# Patient Record
Sex: Female | Born: 1965 | Race: White | Hispanic: No | Marital: Married | Smoking: Former smoker
Health system: Southern US, Community
[De-identification: ages and names within clinical notes are randomized; demographics above are authoritative.]

## PROBLEM LIST (undated history)

## (undated) DIAGNOSIS — R200 Anesthesia of skin: Secondary | ICD-10-CM

## (undated) DIAGNOSIS — Z8709 Personal history of other diseases of the respiratory system: Secondary | ICD-10-CM

## (undated) DIAGNOSIS — F32A Depression, unspecified: Secondary | ICD-10-CM

## (undated) DIAGNOSIS — D649 Anemia, unspecified: Secondary | ICD-10-CM

## (undated) DIAGNOSIS — F329 Major depressive disorder, single episode, unspecified: Secondary | ICD-10-CM

## (undated) DIAGNOSIS — F419 Anxiety disorder, unspecified: Secondary | ICD-10-CM

## (undated) DIAGNOSIS — K219 Gastro-esophageal reflux disease without esophagitis: Secondary | ICD-10-CM

## (undated) DIAGNOSIS — K659 Peritonitis, unspecified: Secondary | ICD-10-CM

## (undated) HISTORY — PX: HERNIA REPAIR: SHX51

## (undated) HISTORY — PX: ESOPHAGOGASTRODUODENOSCOPY: SHX1529

## (undated) HISTORY — PX: GASTRIC BYPASS: SHX52

## (undated) HISTORY — PX: CHOLECYSTECTOMY: SHX55

---

## 2005-02-22 ENCOUNTER — Observation Stay: Payer: Self-pay | Admitting: Obstetrics and Gynecology

## 2005-03-08 ENCOUNTER — Observation Stay: Payer: Self-pay | Admitting: Obstetrics and Gynecology

## 2005-03-19 ENCOUNTER — Inpatient Hospital Stay: Payer: Self-pay

## 2008-06-08 ENCOUNTER — Ambulatory Visit: Payer: Self-pay

## 2014-07-17 ENCOUNTER — Other Ambulatory Visit: Payer: Self-pay | Admitting: Internal Medicine

## 2014-07-17 DIAGNOSIS — N6459 Other signs and symptoms in breast: Secondary | ICD-10-CM

## 2014-08-04 ENCOUNTER — Ambulatory Visit: Payer: Self-pay

## 2014-08-04 ENCOUNTER — Ambulatory Visit
Admission: RE | Admit: 2014-08-04 | Discharge: 2014-08-04 | Disposition: A | Discharge status: Home / Self Care | Payer: No Typology Code available for payment source | Source: Ambulatory Visit | Attending: Internal Medicine | Admitting: Internal Medicine

## 2014-08-04 ENCOUNTER — Other Ambulatory Visit: Payer: Self-pay | Admitting: Internal Medicine

## 2014-08-04 DIAGNOSIS — N6459 Other signs and symptoms in breast: Secondary | ICD-10-CM

## 2014-08-04 DIAGNOSIS — Z1231 Encounter for screening mammogram for malignant neoplasm of breast: Secondary | ICD-10-CM | POA: Insufficient documentation

## 2015-06-08 ENCOUNTER — Other Ambulatory Visit: Payer: Self-pay | Admitting: Internal Medicine

## 2015-06-08 DIAGNOSIS — M5116 Intervertebral disc disorders with radiculopathy, lumbar region: Secondary | ICD-10-CM

## 2015-06-24 ENCOUNTER — Ambulatory Visit
Admission: RE | Admit: 2015-06-24 | Discharge: 2015-06-24 | Disposition: A | Discharge status: Home / Self Care | Payer: BLUE CROSS/BLUE SHIELD | Source: Ambulatory Visit | Attending: Internal Medicine | Admitting: Internal Medicine

## 2015-06-24 DIAGNOSIS — M4806 Spinal stenosis, lumbar region: Secondary | ICD-10-CM | POA: Diagnosis not present

## 2015-06-24 DIAGNOSIS — M5116 Intervertebral disc disorders with radiculopathy, lumbar region: Secondary | ICD-10-CM | POA: Diagnosis not present

## 2015-07-13 ENCOUNTER — Other Ambulatory Visit: Payer: Self-pay | Admitting: Neurosurgery

## 2015-08-09 ENCOUNTER — Other Ambulatory Visit: Payer: Self-pay | Admitting: Internal Medicine

## 2015-08-09 DIAGNOSIS — Z1231 Encounter for screening mammogram for malignant neoplasm of breast: Secondary | ICD-10-CM

## 2015-08-17 ENCOUNTER — Encounter (HOSPITAL_COMMUNITY): Payer: Self-pay

## 2015-08-17 ENCOUNTER — Encounter (HOSPITAL_COMMUNITY)
Admission: RE | Admit: 2015-08-17 | Discharge: 2015-08-17 | Disposition: A | Discharge status: Home / Self Care | Payer: BLUE CROSS/BLUE SHIELD | Source: Ambulatory Visit | Attending: Neurosurgery | Admitting: Neurosurgery

## 2015-08-17 DIAGNOSIS — Z79899 Other long term (current) drug therapy: Secondary | ICD-10-CM | POA: Insufficient documentation

## 2015-08-17 DIAGNOSIS — M7138 Other bursal cyst, other site: Secondary | ICD-10-CM | POA: Insufficient documentation

## 2015-08-17 DIAGNOSIS — R001 Bradycardia, unspecified: Secondary | ICD-10-CM | POA: Insufficient documentation

## 2015-08-17 DIAGNOSIS — R9431 Abnormal electrocardiogram [ECG] [EKG]: Secondary | ICD-10-CM | POA: Insufficient documentation

## 2015-08-17 DIAGNOSIS — Z01812 Encounter for preprocedural laboratory examination: Secondary | ICD-10-CM | POA: Diagnosis not present

## 2015-08-17 DIAGNOSIS — I10 Essential (primary) hypertension: Secondary | ICD-10-CM | POA: Insufficient documentation

## 2015-08-17 DIAGNOSIS — Z01818 Encounter for other preprocedural examination: Secondary | ICD-10-CM | POA: Diagnosis present

## 2015-08-17 DIAGNOSIS — Z87891 Personal history of nicotine dependence: Secondary | ICD-10-CM | POA: Diagnosis not present

## 2015-08-17 HISTORY — DX: Peritonitis, unspecified: K65.9

## 2015-08-17 HISTORY — DX: Gastro-esophageal reflux disease without esophagitis: K21.9

## 2015-08-17 HISTORY — DX: Anxiety disorder, unspecified: F41.9

## 2015-08-17 HISTORY — DX: Anesthesia of skin: R20.0

## 2015-08-17 HISTORY — DX: Personal history of other diseases of the respiratory system: Z87.09

## 2015-08-17 HISTORY — DX: Depression, unspecified: F32.A

## 2015-08-17 HISTORY — DX: Major depressive disorder, single episode, unspecified: F32.9

## 2015-08-17 HISTORY — DX: Anemia, unspecified: D64.9

## 2015-08-17 LAB — BASIC METABOLIC PANEL
Anion gap: 13 (ref 5–15)
BUN: 8 mg/dL (ref 6–20)
CALCIUM: 9.3 mg/dL (ref 8.9–10.3)
CO2: 25 mmol/L (ref 22–32)
CREATININE: 0.83 mg/dL (ref 0.44–1.00)
Chloride: 102 mmol/L (ref 101–111)
GFR calc non Af Amer: >60 mL/min (ref >60)
Glucose, Bld: 94 mg/dL (ref 65–99)
Potassium: 3.3 mmol/L — ABNORMAL LOW (ref 3.5–5.1)
SODIUM: 140 mmol/L (ref 135–145)

## 2015-08-17 LAB — CBC
HCT: 37.8 % (ref 36.0–46.0)
Hemoglobin: 12.6 g/dL (ref 12.0–15.0)
MCH: 26.6 pg (ref 26.0–34.0)
MCHC: 33.3 g/dL (ref 30.0–36.0)
MCV: 79.7 fL (ref 78.0–100.0)
PLATELETS: 259 10*3/uL (ref 150–400)
RBC: 4.74 MIL/uL (ref 3.87–5.11)
RDW: 13.1 % (ref 11.5–15.5)
WBC: 5.8 10*3/uL (ref 4.0–10.5)

## 2015-08-17 LAB — SURGICAL PCR SCREEN
MRSA, PCR: NEGATIVE
Staphylococcus aureus: POSITIVE — AB

## 2015-08-17 LAB — HCG, SERUM, QUALITATIVE: Preg, Serum: NEGATIVE

## 2015-08-17 NOTE — Progress Notes (Signed)
Patient called to inform nurse that she spoke with her PCP and was started on Norvasc 5 mg daily and will continue to monitor her blood pressure at home.

## 2015-08-17 NOTE — Progress Notes (Signed)
PCP- Bethann PunchesMark Miller Cardiologist - denies EKG 08/17/15 CXR - denies  ECHO/Stress Test/Cardiac cath denies  Patient denies chest pain and SOB at PAT appt, blood pressure elevated at arrival, rechecked b/p after patient had been at rest and b/p remained elevated Patient encouraged to contact PCP, patient verbalized understanding,  patient informed RN that she monitors her blood pressure at home and states that it is 130s/80s but been running higher lately and is concerned it is a problem  Chart forwarded to Stefanie LibelAllison Zenalak, PA

## 2015-08-17 NOTE — Progress Notes (Signed)
Patient notified of positive PCR for Staph. Pt states she has prescription for mupirocin and will treat as prescribed.

## 2015-08-17 NOTE — Pre-Procedure Instructions (Addendum)
    Kaitlin [name]  08/17/2015      [facility] [place] - [address] [place] [address] Phone: [phone] Fax: [phone]    Your procedure is scheduled on Wednesday, May 24th, 2017  [facility] Admitting at 8:45 A.M.  Call this number if you have problems the morning of surgery:  [phone]   Remember:  Do not eat food or drink liquids after midnight.  Take these medicines the morning of surgery with A SIP OF WATER Hydrocodone-acetaminophen (Vicodin) if needed, omeprazole (prilosec), tramadol (ultram) if needed  7 days prior to surgery stop taking Diclofenac (voltaren), Aspirin, NSAID's, Aleve, Naproxen, Ibuprofen, Advil, motrin, BC's, Goody's, fish oil, all herbal medications, all vitamins   Do not wear jewelry, make-up or nail polish.  Do not wear lotions, powders, or perfumes.  You may NOT wear deodorant.  Do not shave 48 hours prior to surgery.    Do not bring valuables to the hospital.   [facility] is not responsible for any belongings or valuables.  Contacts, dentures or bridgework may not be worn into surgery.  Leave your suitcase in the car.  After surgery it may be brought to your room.  For patients admitted to the hospital, discharge time will be determined by your treatment team.  Patients discharged the day of surgery will not be allowed to drive home.    Special instructions:  See attached  Please read over the following fact sheets that you were given. Pain Booklet, Coughing and Deep Breathing, MRSA Information and Surgical Site Infection Prevention

## 2015-08-18 NOTE — Progress Notes (Signed)
Anesthesia Chart Review:  Pt is a 50 year old female scheduled for L4-5, L5-S1 lumbar laminectomy/ decompression microdiscectomy on 08/25/2015 with Dr. Lovell SheehanJenkins.   PCP is Dr. Bethann PunchesMark Miller (care everywhere) who is aware of upcoming surgery.   PMH includes:  Anemia. Former smoker. BMI 32  BP at PAT was 182/104 and 162/102 on recheck. After PAT, pt contacted her PCP's office about bp and was started on amlodipine 5mg .   Medications include: amlodipine, prilosec  Preoperative labs reviewed.   EKG 08/17/15: Sinus bradycardia (55 bpm). Septal infarct, age undetermined  If bp acceptable DOS, I anticipate pt can proceed with surgery as scheduled.   Rica Mastngela Kabbe, FNP-BC [facility]/Anesthesiology Phone: [phone] 08/18/2015 4:10 PM

## 2015-08-25 ENCOUNTER — Ambulatory Visit (HOSPITAL_COMMUNITY)
Admission: RE | Admit: 2015-08-25 | Discharge: 2015-08-26 | Disposition: A | Discharge status: Home / Self Care | Payer: BLUE CROSS/BLUE SHIELD | Source: Ambulatory Visit | Attending: Neurosurgery | Admitting: Neurosurgery

## 2015-08-25 ENCOUNTER — Encounter (HOSPITAL_COMMUNITY)
Admission: RE | Disposition: A | Discharge status: Home / Self Care | Payer: Self-pay | Source: Ambulatory Visit | Attending: Neurosurgery

## 2015-08-25 ENCOUNTER — Ambulatory Visit (HOSPITAL_COMMUNITY): Payer: BLUE CROSS/BLUE SHIELD | Admitting: Certified Registered Nurse Anesthetist

## 2015-08-25 ENCOUNTER — Encounter (HOSPITAL_COMMUNITY): Payer: Self-pay | Admitting: *Deleted

## 2015-08-25 ENCOUNTER — Ambulatory Visit (HOSPITAL_COMMUNITY): Payer: BLUE CROSS/BLUE SHIELD

## 2015-08-25 ENCOUNTER — Ambulatory Visit (HOSPITAL_COMMUNITY): Payer: BLUE CROSS/BLUE SHIELD | Admitting: Emergency Medicine

## 2015-08-25 DIAGNOSIS — F419 Anxiety disorder, unspecified: Secondary | ICD-10-CM | POA: Insufficient documentation

## 2015-08-25 DIAGNOSIS — I10 Essential (primary) hypertension: Secondary | ICD-10-CM | POA: Diagnosis not present

## 2015-08-25 DIAGNOSIS — Z9884 Bariatric surgery status: Secondary | ICD-10-CM | POA: Insufficient documentation

## 2015-08-25 DIAGNOSIS — M5416 Radiculopathy, lumbar region: Secondary | ICD-10-CM | POA: Diagnosis not present

## 2015-08-25 DIAGNOSIS — M7138 Other bursal cyst, other site: Secondary | ICD-10-CM | POA: Diagnosis present

## 2015-08-25 DIAGNOSIS — E669 Obesity, unspecified: Secondary | ICD-10-CM | POA: Diagnosis not present

## 2015-08-25 DIAGNOSIS — Z87891 Personal history of nicotine dependence: Secondary | ICD-10-CM | POA: Diagnosis not present

## 2015-08-25 DIAGNOSIS — M549 Dorsalgia, unspecified: Secondary | ICD-10-CM

## 2015-08-25 HISTORY — PX: LUMBAR LAMINECTOMY/DECOMPRESSION MICRODISCECTOMY: SHX5026

## 2015-08-25 SURGERY — LUMBAR LAMINECTOMY/DECOMPRESSION MICRODISCECTOMY 2 LEVELS
Anesthesia: General | Site: Back | Laterality: Right

## 2015-08-25 MED ORDER — BACITRACIN ZINC 500 UNIT/GM EX OINT
TOPICAL_OINTMENT | CUTANEOUS | Status: DC | PRN
  Administered 2015-08-25: 1 via TOPICAL

## 2015-08-25 MED ORDER — MIDAZOLAM HCL 2 MG/2ML IJ SOLN
INTRAMUSCULAR | Status: AC
  Filled 2015-08-25: qty 2

## 2015-08-25 MED ORDER — NEOSTIGMINE METHYLSULFATE 5 MG/5ML IV SOSY
PREFILLED_SYRINGE | INTRAVENOUS | Status: DC | PRN
  Administered 2015-08-25: 3 mg via INTRAVENOUS

## 2015-08-25 MED ORDER — ROCURONIUM BROMIDE 10 MG/ML (PF) SYRINGE
PREFILLED_SYRINGE | INTRAVENOUS | Status: DC | PRN
  Administered 2015-08-25: 50 mg via INTRAVENOUS

## 2015-08-25 MED ORDER — BUPIVACAINE LIPOSOME 1.3 % IJ SUSP
INTRAMUSCULAR | Status: DC | PRN
  Administered 2015-08-25: 20 mL

## 2015-08-25 MED ORDER — ACETAMINOPHEN 325 MG PO TABS: 650.0000 mg | ORAL_TABLET | ORAL | Status: DC | PRN

## 2015-08-25 MED ORDER — 0.9 % SODIUM CHLORIDE (POUR BTL) OPTIME
TOPICAL | Status: DC | PRN
  Administered 2015-08-25: 1000 mL

## 2015-08-25 MED ORDER — AMLODIPINE BESYLATE 5 MG PO TABS
5.0000 mg | ORAL_TABLET | Freq: Every day | ORAL | Status: DC
  Filled 2015-08-25: qty 1

## 2015-08-25 MED ORDER — NEOSTIGMINE METHYLSULFATE 5 MG/5ML IV SOSY
PREFILLED_SYRINGE | INTRAVENOUS | Status: AC
  Filled 2015-08-25: qty 10

## 2015-08-25 MED ORDER — PHENYLEPHRINE HCL 10 MG/ML IJ SOLN
INTRAMUSCULAR | Status: AC
  Filled 2015-08-25: qty 1

## 2015-08-25 MED ORDER — EPHEDRINE SULFATE-NACL 50-0.9 MG/10ML-% IV SOSY
PREFILLED_SYRINGE | INTRAVENOUS | Status: DC | PRN
  Administered 2015-08-25: 5 mg via INTRAVENOUS

## 2015-08-25 MED ORDER — SODIUM CHLORIDE 0.9 % IR SOLN
Status: DC | PRN
  Administered 2015-08-25: 500 mL

## 2015-08-25 MED ORDER — ACETAMINOPHEN 650 MG RE SUPP: 650.0000 mg | RECTAL | Status: DC | PRN

## 2015-08-25 MED ORDER — DOCUSATE SODIUM 100 MG PO CAPS
100.0000 mg | ORAL_CAPSULE | Freq: Two times a day (BID) | ORAL | Status: DC
  Administered 2015-08-25 (×2): 100 mg via ORAL
  Filled 2015-08-25: qty 1

## 2015-08-25 MED ORDER — ROCURONIUM BROMIDE 50 MG/5ML IV SOLN
INTRAVENOUS | Status: AC
  Filled 2015-08-25: qty 5

## 2015-08-25 MED ORDER — TRAMADOL HCL 50 MG PO TABS
50.0000 mg | ORAL_TABLET | Freq: Four times a day (QID) | ORAL | Status: DC | PRN
Start: 2015-08-25 — End: 2015-08-26

## 2015-08-25 MED ORDER — GLYCOPYRROLATE 0.2 MG/ML IV SOSY
PREFILLED_SYRINGE | INTRAVENOUS | Status: DC | PRN
  Administered 2015-08-25: .4 mg via INTRAVENOUS

## 2015-08-25 MED ORDER — LIDOCAINE 2% (20 MG/ML) 5 ML SYRINGE
INTRAMUSCULAR | Status: DC | PRN
  Administered 2015-08-25: 100 mg via INTRAVENOUS

## 2015-08-25 MED ORDER — BUPIVACAINE LIPOSOME 1.3 % IJ SUSP
20.0000 mL | INTRAMUSCULAR | Status: DC
  Filled 2015-08-25: qty 20

## 2015-08-25 MED ORDER — DICLOFENAC SODIUM 75 MG PO TBEC
75.0000 mg | DELAYED_RELEASE_TABLET | Freq: Two times a day (BID) | ORAL | Status: DC
  Administered 2015-08-25: 75 mg via ORAL
  Filled 2015-08-25 (×3): qty 1

## 2015-08-25 MED ORDER — HYDROMORPHONE HCL 1 MG/ML IJ SOLN
INTRAMUSCULAR | Status: AC
  Administered 2015-08-25: 0.5 mg via INTRAVENOUS
  Filled 2015-08-25: qty 1

## 2015-08-25 MED ORDER — EPHEDRINE 5 MG/ML INJ
INTRAVENOUS | Status: AC
  Filled 2015-08-25: qty 10

## 2015-08-25 MED ORDER — HEMOSTATIC AGENTS (NO CHARGE) OPTIME
TOPICAL | Status: DC | PRN
  Administered 2015-08-25: 1 via TOPICAL

## 2015-08-25 MED ORDER — ONDANSETRON HCL 4 MG/2ML IJ SOLN
INTRAMUSCULAR | Status: DC | PRN
  Administered 2015-08-25: 4 mg via INTRAVENOUS

## 2015-08-25 MED ORDER — ALUM & MAG HYDROXIDE-SIMETH 200-200-20 MG/5ML PO SUSP: 30.0000 mL | Freq: Four times a day (QID) | ORAL | Status: DC | PRN

## 2015-08-25 MED ORDER — PROPOFOL 10 MG/ML IV BOLUS
INTRAVENOUS | Status: DC | PRN
  Administered 2015-08-25: 150 mg via INTRAVENOUS

## 2015-08-25 MED ORDER — LIDOCAINE 2% (20 MG/ML) 5 ML SYRINGE
INTRAMUSCULAR | Status: AC
  Filled 2015-08-25: qty 15

## 2015-08-25 MED ORDER — THROMBIN 5000 UNITS EX SOLR
CUTANEOUS | Status: DC | PRN
  Administered 2015-08-25 (×2): 5000 [IU] via TOPICAL

## 2015-08-25 MED ORDER — FENTANYL CITRATE (PF) 100 MCG/2ML IJ SOLN
INTRAMUSCULAR | Status: AC
  Administered 2015-08-25: 50 ug via INTRAVENOUS
  Filled 2015-08-25: qty 2

## 2015-08-25 MED ORDER — ONDANSETRON HCL 4 MG/2ML IJ SOLN: 4.0000 mg | INTRAMUSCULAR | Status: DC | PRN

## 2015-08-25 MED ORDER — HYDROCODONE-ACETAMINOPHEN 5-325 MG PO TABS: 1.0000 | ORAL_TABLET | ORAL | Status: DC | PRN

## 2015-08-25 MED ORDER — PHENYLEPHRINE 40 MCG/ML (10ML) SYRINGE FOR IV PUSH (FOR BLOOD PRESSURE SUPPORT)
PREFILLED_SYRINGE | INTRAVENOUS | Status: AC
  Filled 2015-08-25: qty 20

## 2015-08-25 MED ORDER — PHENOL 1.4 % MT LIQD: 1.0000 | OROMUCOSAL | Status: DC | PRN

## 2015-08-25 MED ORDER — SUCCINYLCHOLINE CHLORIDE 200 MG/10ML IV SOSY
PREFILLED_SYRINGE | INTRAVENOUS | Status: AC
  Filled 2015-08-25: qty 10

## 2015-08-25 MED ORDER — DEXAMETHASONE SODIUM PHOSPHATE 10 MG/ML IJ SOLN
INTRAMUSCULAR | Status: DC | PRN
  Administered 2015-08-25: 5 mg via INTRAVENOUS

## 2015-08-25 MED ORDER — GLYCOPYRROLATE 0.2 MG/ML IV SOSY
PREFILLED_SYRINGE | INTRAVENOUS | Status: AC
  Filled 2015-08-25: qty 6

## 2015-08-25 MED ORDER — MORPHINE SULFATE (PF) 2 MG/ML IV SOLN: 1.0000 mg | INTRAVENOUS | Status: DC | PRN

## 2015-08-25 MED ORDER — DIAZEPAM 5 MG PO TABS
5.0000 mg | ORAL_TABLET | Freq: Four times a day (QID) | ORAL | Status: DC | PRN
  Administered 2015-08-25: 5 mg via ORAL

## 2015-08-25 MED ORDER — PHENYLEPHRINE 40 MCG/ML (10ML) SYRINGE FOR IV PUSH (FOR BLOOD PRESSURE SUPPORT)
PREFILLED_SYRINGE | INTRAVENOUS | Status: DC | PRN
  Administered 2015-08-25 (×3): 40 ug via INTRAVENOUS

## 2015-08-25 MED ORDER — MENTHOL 3 MG MT LOZG: 1.0000 | LOZENGE | OROMUCOSAL | Status: DC | PRN

## 2015-08-25 MED ORDER — CEFAZOLIN SODIUM-DEXTROSE 2-4 GM/100ML-% IV SOLN
2.0000 g | INTRAVENOUS | Status: AC
  Administered 2015-08-25: 2 g via INTRAVENOUS
  Filled 2015-08-25: qty 100

## 2015-08-25 MED ORDER — PANTOPRAZOLE SODIUM 40 MG PO TBEC: 40.0000 mg | DELAYED_RELEASE_TABLET | Freq: Every day | ORAL | Status: DC

## 2015-08-25 MED ORDER — FENTANYL CITRATE (PF) 100 MCG/2ML IJ SOLN
INTRAMUSCULAR | Status: DC | PRN
  Administered 2015-08-25: 25 ug via INTRAVENOUS
  Administered 2015-08-25: 50 ug via INTRAVENOUS
  Administered 2015-08-25: 25 ug via INTRAVENOUS
  Administered 2015-08-25: 50 ug via INTRAVENOUS
  Administered 2015-08-25: 100 ug via INTRAVENOUS

## 2015-08-25 MED ORDER — KETAMINE HCL 100 MG/ML IJ SOLN
INTRAMUSCULAR | Status: AC
  Administered 2015-08-25: 30 mg via INTRAVENOUS
  Filled 2015-08-25: qty 1

## 2015-08-25 MED ORDER — DEXAMETHASONE SODIUM PHOSPHATE 10 MG/ML IJ SOLN
INTRAMUSCULAR | Status: AC
  Filled 2015-08-25: qty 2

## 2015-08-25 MED ORDER — FENTANYL CITRATE (PF) 100 MCG/2ML IJ SOLN
25.0000 ug | INTRAMUSCULAR | Status: DC | PRN
  Administered 2015-08-25 (×3): 50 ug via INTRAVENOUS

## 2015-08-25 MED ORDER — OXYCODONE-ACETAMINOPHEN 5-325 MG PO TABS
ORAL_TABLET | ORAL | Status: AC
  Filled 2015-08-25: qty 2

## 2015-08-25 MED ORDER — DIAZEPAM 5 MG PO TABS
ORAL_TABLET | ORAL | Status: AC
  Filled 2015-08-25: qty 1

## 2015-08-25 MED ORDER — CEFAZOLIN SODIUM-DEXTROSE 2-4 GM/100ML-% IV SOLN
2.0000 g | Freq: Three times a day (TID) | INTRAVENOUS | Status: AC
  Administered 2015-08-25 – 2015-08-26 (×2): 2 g via INTRAVENOUS
  Filled 2015-08-25 (×2): qty 100

## 2015-08-25 MED ORDER — MIDAZOLAM HCL 5 MG/5ML IJ SOLN
INTRAMUSCULAR | Status: DC | PRN
  Administered 2015-08-25: 2 mg via INTRAVENOUS

## 2015-08-25 MED ORDER — BUPIVACAINE-EPINEPHRINE (PF) 0.5% -1:200000 IJ SOLN
INTRAMUSCULAR | Status: DC | PRN
  Administered 2015-08-25: 10 mL via PERINEURAL

## 2015-08-25 MED ORDER — OXYCODONE-ACETAMINOPHEN 5-325 MG PO TABS
1.0000 | ORAL_TABLET | ORAL | Status: DC | PRN
  Administered 2015-08-25 – 2015-08-26 (×3): 2 via ORAL
  Filled 2015-08-25 (×2): qty 2

## 2015-08-25 MED ORDER — FENTANYL CITRATE (PF) 250 MCG/5ML IJ SOLN
INTRAMUSCULAR | Status: AC
  Filled 2015-08-25: qty 5

## 2015-08-25 MED ORDER — ONDANSETRON HCL 4 MG/2ML IJ SOLN
INTRAMUSCULAR | Status: AC
  Filled 2015-08-25: qty 6

## 2015-08-25 MED ORDER — BISACODYL 10 MG RE SUPP: 10.0000 mg | Freq: Every day | RECTAL | Status: DC | PRN

## 2015-08-25 MED ORDER — LACTATED RINGERS IV SOLN
INTRAVENOUS | Status: DC
  Administered 2015-08-25 (×2): via INTRAVENOUS

## 2015-08-25 MED ORDER — HYDROMORPHONE HCL 1 MG/ML IJ SOLN
0.5000 mg | INTRAMUSCULAR | Status: AC | PRN
  Administered 2015-08-25 (×2): 0.5 mg via INTRAVENOUS

## 2015-08-25 MED ORDER — LACTATED RINGERS IV SOLN: INTRAVENOUS | Status: DC

## 2015-08-25 SURGICAL SUPPLY — 47 items
BAG DECANTER FOR FLEXI CONT (MISCELLANEOUS) ×3 IMPLANT
BENZOIN TINCTURE PRP APPL 2/3 (GAUZE/BANDAGES/DRESSINGS) ×3 IMPLANT
BLADE CLIPPER SURG (BLADE) IMPLANT
BRUSH SCRUB EZ PLAIN DRY (MISCELLANEOUS) ×3 IMPLANT
BUR MATCHSTICK NEURO 3.0 LAGG (BURR) ×3 IMPLANT
BUR PRECISION FLUTE 6.0 (BURR) ×3 IMPLANT
CANISTER SUCT 3000ML PPV (MISCELLANEOUS) ×3 IMPLANT
CLOSURE WOUND 1/2 X4 (GAUZE/BANDAGES/DRESSINGS) ×1
DRAPE LAPAROTOMY 100X72X124 (DRAPES) ×3 IMPLANT
DRAPE MICROSCOPE LEICA (MISCELLANEOUS) ×3 IMPLANT
DRAPE POUCH INSTRU U-SHP 10X18 (DRAPES) ×3 IMPLANT
DRAPE SURG 17X23 STRL (DRAPES) ×12 IMPLANT
ELECT BLADE 4.0 EZ CLEAN MEGAD (MISCELLANEOUS) ×3
ELECT REM PT RETURN 9FT ADLT (ELECTROSURGICAL) ×3
ELECTRODE BLDE 4.0 EZ CLN MEGD (MISCELLANEOUS) ×1 IMPLANT
ELECTRODE REM PT RTRN 9FT ADLT (ELECTROSURGICAL) ×1 IMPLANT
GAUZE SPONGE 4X4 12PLY STRL (GAUZE/BANDAGES/DRESSINGS) ×3 IMPLANT
GAUZE SPONGE 4X4 16PLY XRAY LF (GAUZE/BANDAGES/DRESSINGS) IMPLANT
GLOVE BIO SURGEON STRL SZ8 (GLOVE) ×3 IMPLANT
GLOVE BIO SURGEON STRL SZ8.5 (GLOVE) ×3 IMPLANT
GLOVE EXAM NITRILE LRG STRL (GLOVE) IMPLANT
GLOVE EXAM NITRILE MD LF STRL (GLOVE) IMPLANT
GLOVE EXAM NITRILE XL STR (GLOVE) IMPLANT
GLOVE EXAM NITRILE XS STR PU (GLOVE) IMPLANT
GLOVE SS N UNI LF 7.0 STRL (GLOVE) ×6 IMPLANT
GOWN STRL REUS W/ TWL LRG LVL3 (GOWN DISPOSABLE) IMPLANT
GOWN STRL REUS W/ TWL XL LVL3 (GOWN DISPOSABLE) ×1 IMPLANT
GOWN STRL REUS W/TWL 2XL LVL3 (GOWN DISPOSABLE) IMPLANT
GOWN STRL REUS W/TWL LRG LVL3 (GOWN DISPOSABLE)
GOWN STRL REUS W/TWL XL LVL3 (GOWN DISPOSABLE) ×2
KIT BASIN OR (CUSTOM PROCEDURE TRAY) ×3 IMPLANT
KIT ROOM TURNOVER OR (KITS) ×3 IMPLANT
NEEDLE HYPO 21X1.5 SAFETY (NEEDLE) IMPLANT
NEEDLE HYPO 22GX1.5 SAFETY (NEEDLE) ×3 IMPLANT
NS IRRIG 1000ML POUR BTL (IV SOLUTION) ×3 IMPLANT
PACK LAMINECTOMY NEURO (CUSTOM PROCEDURE TRAY) ×3 IMPLANT
PAD ARMBOARD 7.5X6 YLW CONV (MISCELLANEOUS) ×9 IMPLANT
PATTIES SURGICAL .5 X1 (DISPOSABLE) IMPLANT
RUBBERBAND STERILE (MISCELLANEOUS) ×6 IMPLANT
SPONGE SURGIFOAM ABS GEL SZ50 (HEMOSTASIS) ×3 IMPLANT
STRIP CLOSURE SKIN 1/2X4 (GAUZE/BANDAGES/DRESSINGS) ×2 IMPLANT
SUT VIC AB 1 CT1 18XBRD ANBCTR (SUTURE) ×2 IMPLANT
SUT VIC AB 1 CT1 8-18 (SUTURE) ×4
SUT VIC AB 2-0 CP2 18 (SUTURE) ×6 IMPLANT
TOWEL OR 17X24 6PK STRL BLUE (TOWEL DISPOSABLE) ×3 IMPLANT
TOWEL OR 17X26 10 PK STRL BLUE (TOWEL DISPOSABLE) ×3 IMPLANT
WATER STERILE IRR 1000ML POUR (IV SOLUTION) ×3 IMPLANT

## 2015-08-25 NOTE — Transfer of Care (Signed)
Immediate Anesthesia Transfer of Care Note  Patient: Rochel BromeMichelle B Litzenberger  Procedure(s) Performed: Procedure(s) with comments: LUMBAR LAMINECTOMY/DECOMPRESSION MICRODISCECTOMY 2 LEVELS (Right) - Right L45 L5S1 laminectomy laminotomy with resection of synovial cyst  Patient Location: PACU  Anesthesia Type:General  Level of Consciousness: awake, alert  and oriented  Airway & Oxygen Therapy: Patient Spontanous Breathing and Patient connected to nasal cannula oxygen  Post-op Assessment: Report given to RN and Post -op Vital signs reviewed and stable  Post vital signs: Reviewed and stable  Last Vitals:  Filed Vitals:   08/25/15 0908  BP: 162/91  Pulse: 102  Temp: 36.8 C  Resp: 18    Last Pain: There were no vitals filed for this visit.       Complications: No apparent anesthesia complications

## 2015-08-25 NOTE — Anesthesia Procedure Notes (Signed)
Procedure Name: Intubation Date/Time: 08/25/2015 12:02 PM Performed by: Daiva EvesAVENEL, Ichelle Harral W Pre-anesthesia Checklist: Patient identified, Timeout performed, Emergency Drugs available, Suction available and Patient being monitored Patient Re-evaluated:Patient Re-evaluated prior to inductionOxygen Delivery Method: Circle system utilized Preoxygenation: Pre-oxygenation with 100% oxygen Intubation Type: IV induction Ventilation: Mask ventilation without difficulty Laryngoscope Size: Mac and 3 Grade View: Grade I Tube type: Oral Tube size: 7.0 mm Number of attempts: 1 Airway Equipment and Method: Stylet Placement Confirmation: breath sounds checked- equal and bilateral,  positive ETCO2,  CO2 detector and ETT inserted through vocal cords under direct vision Secured at: 22 cm Tube secured with: Tape Dental Injury: Teeth and Oropharynx as per pre-operative assessment

## 2015-08-25 NOTE — Anesthesia Preprocedure Evaluation (Signed)
Anesthesia Evaluation  Patient identified by MRN, date of birth, ID band Patient awake    Reviewed: Allergy & Precautions, NPO status , Patient's Chart, lab work & pertinent test results  Airway Mallampati: I  TM Distance: >3 FB Neck ROM: Full    Dental  (+) Teeth Intact, Dental Advisory Given Veneers, caps in back, broken temporary tooth:   Pulmonary former smoker,    Pulmonary exam normal breath sounds clear to auscultation       Cardiovascular hypertension, Pt. on medications Normal cardiovascular exam Rhythm:Regular Rate:Normal     Neuro/Psych PSYCHIATRIC DISORDERS Anxiety Depression RLE numbness, tingling L5 cyst    GI/Hepatic Neg liver ROS, GERD  Medicated and Controlled,S/p gastric bypass   Endo/Other  Obesity   Renal/GU negative Renal ROS     Musculoskeletal negative musculoskeletal ROS (+)   Abdominal   Peds  Hematology negative hematology ROS (+)   Anesthesia Other Findings Day of surgery medications reviewed with the patient.  Reproductive/Obstetrics negative OB ROS                             Anesthesia Physical Anesthesia Plan  ASA: II  Anesthesia Plan: General   Post-op Pain Management:    Induction: Intravenous  Airway Management Planned: Oral ETT  Additional Equipment:   Intra-op Plan:   Post-operative Plan: Extubation in OR  Informed Consent: I have reviewed the patients History and Physical, chart, labs and discussed the procedure including the risks, benefits and alternatives for the proposed anesthesia with the patient or authorized representative who has indicated his/her understanding and acceptance.   Dental advisory given  Plan Discussed with: CRNA  Anesthesia Plan Comments: (Risks/benefits of general anesthesia discussed with patient including risk of damage to teeth, lips, gum, and tongue, nausea/vomiting, allergic reactions to medications, and  the possibility of heart attack, stroke and death.  All patient questions answered.  Patient wishes to proceed.)        Anesthesia Quick Evaluation

## 2015-08-25 NOTE — H&P (Signed)
Subjective: The patient is a 50 year old white female who has complained of right leg pain. She has failed medical management and was worked up with a lumbar MRI. This demonstrated a L4-5 and L5-S1 synovial cyst. I discussed the situation with the patient. She has decided to proceed with surgery   Past Medical History  Diagnosis Date  . GERD (gastroesophageal reflux disease)   . History of bronchitis   . Anxiety   . Depression     History of  . Peritonitis (HCC)     gastric bypass surgery  . Anemia   . Numbness     back of right calf    Past Surgical History  Procedure Laterality Date  . Cholecystectomy    . Gastric bypass    . Hernia repair    . Esophagogastroduodenoscopy      Not on File  Social History  Substance Use Topics  . Smoking status: Former Smoker    Quit date: 05/17/1998  . Smokeless tobacco: Never Used  . Alcohol Use: Yes     Comment: socially    History reviewed. No pertinent family history. Prior to Admission medications   Medication Sig Start Date End Date Taking? Authorizing Provider  amLODipine (NORVASC) 5 MG tablet Take 5 mg by mouth daily.   Yes Historical Provider, MD  diclofenac (VOLTAREN) 75 MG EC tablet Take 75 mg by mouth 2 (two) times daily. 07/17/15  Yes Historical Provider, MD  HYDROcodone-acetaminophen (NORCO/VICODIN) 5-325 MG tablet Take 1-2 tablets by mouth at bedtime. 07/13/15  Yes Historical Provider, MD  omeprazole (PRILOSEC) 20 MG capsule Take 20 mg by mouth daily as needed. allergies   Yes Historical Provider, MD  traMADol (ULTRAM) 50 MG tablet Take 50 mg by mouth 3 (three) times daily as needed. pain 06/16/15  Yes Historical Provider, MD     Review of Systems  Positive ROS: As above  All other systems have been reviewed and were otherwise negative with the exception of those mentioned in the HPI and as above.  Objective: Vital signs in last 24 hours: Temp:  [98.2 F (36.8 C)] 98.2 F (36.8 C) (05/24 0908) Pulse Rate:  [102] 102  (05/24 0908) Resp:  [18] 18 (05/24 0908) BP: (162)/(91) 162/91 mmHg (05/24 0908) SpO2:  [98 %] 98 % (05/24 0908) Weight:  [82.555 kg (182 lb)] 82.555 kg (182 lb) (05/24 0908)  General Appearance: Alert, cooperative, no distress, Head: Normocephalic, without obvious abnormality, atraumatic Eyes: PERRL, conjunctiva/corneas clear, EOM's intact,    Ears: Normal  Throat: Normal  Neck: Supple, symmetrical, trachea midline, no adenopathy; thyroid: No enlargement/tenderness/nodules; no carotid bruit or JVD Back: Symmetric, no curvature, ROM normal, no CVA tenderness Lungs: Clear to auscultation bilaterally, respirations unlabored Heart: Regular rate and rhythm, no murmur, rub or gallop Abdomen: Soft, non-tender,, no masses, no organomegaly Extremities: Extremities normal, atraumatic, no cyanosis or edema Pulses: 2+ and symmetric all extremities Skin: Skin color, texture, turgor normal, no rashes or lesions  NEUROLOGIC:   Mental status: alert and oriented, no aphasia, good attention span, Fund of knowledge/ memory ok Motor Exam - grossly normal Sensory Exam - grossly normal Reflexes:  Coordination - grossly normal Gait - grossly normal Balance - grossly normal Cranial Nerves: I: smell Not tested  II: visual acuity  OS: Normal  OD: Normal   II: visual fields Full to confrontation  II: pupils Equal, round, reactive to light  III,VII: ptosis None  III,IV,VI: extraocular muscles  Full ROM  V: mastication Normal  V: facial  light touch sensation  Normal  V,VII: corneal reflex  Present  VII: facial muscle function - upper  Normal  VII: facial muscle function - lower Normal  VIII: hearing Not tested  IX: soft palate elevation  Normal  IX,X: gag reflex Present  XI: trapezius strength  5/5  XI: sternocleidomastoid strength 5/5  XI: neck flexion strength  5/5  XII: tongue strength  Normal    Data Review Lab Results  Component Value Date   WBC 5.8 08/17/2015   HGB 12.6 08/17/2015    HCT 37.8 08/17/2015   MCV 79.7 08/17/2015   PLT 259 08/17/2015   Lab Results  Component Value Date   NA 140 08/17/2015   K 3.3* 08/17/2015   CL 102 08/17/2015   CO2 25 08/17/2015   BUN 8 08/17/2015   CREATININE 0.83 08/17/2015   GLUCOSE 94 08/17/2015   No results found for: INR, PROTIME  Assessment/Plan: 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 I have reviewed her MRI scan with her and pointed out the abnormalities. We have discussed the various treatment options including surgery. We have discussed the risks, benefits, alternatives, and likelihood of achieving her goals with surgery. I have answered all the patient's questions. She has decided to proceed with surgery.   Slayton Lubitz D 08/25/2015 10:56 AM

## 2015-08-25 NOTE — Progress Notes (Signed)
Subjective:  The patient is alert and pleasant. She is in no apparent distress. She looks well.  Objective: Vital signs in last 24 hours: Temp:  [98.2 F (36.8 C)] 98.2 F (36.8 C) (05/24 0908) Pulse Rate:  [102] 102 (05/24 0908) Resp:  [18] 18 (05/24 0908) BP: (162)/(91) 162/91 mmHg (05/24 0908) SpO2:  [98 %] 98 % (05/24 0908) Weight:  [82.555 kg (182 lb)] 82.555 kg (182 lb) (05/24 0908)  Intake/Output from previous day:   Intake/Output this shift: Total I/O In: 1300 [I.V.:1300] Out: 200 [Blood:200]  Physical exam the patient is alert and pleasant. She is moving her lower extremities well.  Lab Results: No results for input(s): WBC, HGB, HCT, PLT in the last 72 hours. BMET No results for input(s): NA, K, CL, CO2, GLUCOSE, BUN, CREATININE, CALCIUM in the last 72 hours.  Studies/Results: Dg Lumbar Spine 1 View  08/25/2015  CLINICAL DATA:  Intraoperative localization 4 L4-5 and L5-S1 laminectomy EXAM: LUMBAR SPINE - 1 VIEW COMPARISON:  06/24/2015 FINDINGS: Localization probe directed toward the L5-S1 disc space level. IMPRESSION: Intraoperative localization Electronically Signed   By: Esperanza Heiraymond  Rubner M.D.   On: 08/25/2015 13:28    Assessment/Plan: The patient is doing well. I spoke with her husband.      Jahyra Sukup D 08/25/2015, 2:24 PM

## 2015-08-25 NOTE — Op Note (Signed)
Brief history: The patient is a 50 year old white female who has complained of back and right leg pain consistent with a lumbar radiculopathy. She has failed medical management and was worked up with a lumbar MRI. This demonstrated large synovial cyst at L4-5 and L5-S1 on the right. I discussed the various treatment options with the patient including surgery. She has weighed the risks, benefits, and alternatives to surgery and decided proceed with a right L4-5 and L5-S1 laminotomy/laminectomy/foraminotomy for resection of synovial cyst.  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 Delma OfficerJeff Annamarie Yamaguchi  Asst.: Dr. Romeo AppleBen Ditty  Anesthesia: Gen. endotracheal  Estimated blood loss: 100 mL  Drains: None  Complications: None  Description of procedure: The patient was brought to the operating room by the anesthesia team. General endotracheal anesthesia was induced. The patient was turned to the prone position on the Wilson frame. The patient's lumbosacral region was then prepared with Betadine scrub and Betadine solution. Sterile drapes were applied.  I then injected the area to be incised with Marcaine with epinephrine solution. I then used a scalpel to make a linear midline incision over the L4-5 and L5-S1 intervertebral disc space. I then used electrocautery to perform a right sided subperiosteal dissection exposing the spinous process and lamina of L4, L5 and the upper sacrum. We obtained intraoperative radiograph to confirm our location. I then inserted the [facility] retractor for exposure.  We then brought the operative microscope into the field. Under its magnification and illumination we completed the  microdissection. I used a high-speed drill to perform a laminotomy at L4-5 and L5-S1 on the right. I then used a Kerrison punches to complete the right L4 hemilaminectomy and to widen the laminotomy at L5-S1 and removed the ligamentum flavum at L4-5 and L5-S1 on the right. We encountered large synovial cyst at both levels. We then used microdissection to free up the thecal sac and the right L4, L5 and S1 nerve root from the synovial cyst. We removed the synovial cyst using the pituitary forceps and the Kerrison punches. We sent a specimen to the pathologist. I then used a Kerrison punch to perform a foraminotomy at about the right L4, L5 and S1 nerve root. We inspected the intervertebral disc at L4-5 and L5-S1. There were no herniations.   I then palpated along the ventral surface of the thecal sac and along exit route of the right L4, L5 and S1 nerve root and noted that the neural structures were well decompressed. This completed the decompression.  We then obtained hemostasis using bipolar electrocautery. We irrigated the wound out with bacitracin solution. We then removed the retractor. We then reapproximated the patient's thoracolumbar fascia with interrupted #1 Vicryl suture. We then reapproximated the patient's subcutaneous tissue with interrupted 2-0 Vicryl suture. We then reapproximated patient's skin with Steri-Strips and benzoin. The was then coated with bacitracin ointment. The drapes were removed. The patient was subsequently returned to the supine position where they were extubated by the anesthesia team. The patient was then transported to the postanesthesia care unit in stable condition. All sponge instrument and needle counts were reportedly correct at the end of this case.

## 2015-08-26 ENCOUNTER — Encounter (HOSPITAL_COMMUNITY): Payer: Self-pay | Admitting: Neurosurgery

## 2015-08-26 DIAGNOSIS — M5416 Radiculopathy, lumbar region: Secondary | ICD-10-CM | POA: Diagnosis not present

## 2015-08-26 MED ORDER — OXYCODONE-ACETAMINOPHEN 5-325 MG PO TABS: 1.0000 | ORAL_TABLET | ORAL | Status: AC | PRN

## 2015-08-26 MED ORDER — DOCUSATE SODIUM 100 MG PO CAPS
100.0000 mg | ORAL_CAPSULE | Freq: Two times a day (BID) | ORAL | Status: AC
Start: 2015-08-26 — End: ?

## 2015-08-26 MED ORDER — CYCLOBENZAPRINE HCL 10 MG PO TABS: 10.0000 mg | ORAL_TABLET | Freq: Three times a day (TID) | ORAL | Status: AC | PRN

## 2015-08-26 NOTE — Discharge Summary (Signed)
Physician Discharge Summary  Patient ID: Kaitlin Kirby MRN: [id number] DOB/AGE: 1965/08/26 50 y.o.  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 The surgery went well.  The patient's postoperative course was unremarkable. On postoperative day #1 she requested discharge home. The patient, and her husband, were given written and oral discharge instructions. All other questions were answered.  Consults: Physical therapy Significant Diagnostic Studies: None Treatments: Right L4-5 and L5-S1 laminectomy/laminotomy/foraminotomy for resection of synovial cyst using micro-dissection to decompress the right L4, L5 and S1 nerve roots. Discharge Exam: Blood pressure 134/78, pulse 92, temperature 99 F (37.2 C), temperature source Oral, resp. rate 18, weight 82.555 kg (182 lb), last menstrual period 08/16/2015, SpO2 100 %. The patient is alert and pleasant. She looks well. Her strength is grossly normal. Her dressing is starting to fall off.  Disposition: Home  Discharge Instructions    Call MD for:  difficulty breathing, headache or visual disturbances    Complete by:  As directed      Call MD for:  extreme fatigue    Complete by:  As directed      Call MD for:  hives    Complete by:  As directed      Call MD for:  persistant dizziness or light-headedness    Complete by:  As directed      Call MD for:  persistant nausea and vomiting    Complete by:  As directed      Call MD for:  redness, tenderness, or signs of infection (pain, swelling, redness, odor or green/yellow discharge around incision site)    Complete by:  As directed      Call MD for:   severe uncontrolled pain    Complete by:  As directed      Call MD for:  temperature >100.4    Complete by:  As directed      Diet - low sodium heart healthy    Complete by:  As directed      Discharge instructions    Complete by:  As directed   Call [phone] for a followup appointment. Take a stool softener while you are using pain medications.     Driving Restrictions    Complete by:  As directed   Do not drive for 2 weeks.     Increase activity slowly    Complete by:  As directed      Lifting restrictions    Complete by:  As directed   Do not lift more than 5 pounds. No excessive bending or twisting.     May shower / Bathe    Complete by:  As directed   He may shower after the pain she is removed 3 days after surgery. Leave the incision alone.     Remove dressing in 48 hours    Complete by:  As directed   Your stitches are under the scan and will dissolve by themselves. The Steri-Strips will fall off after you take a few showers. Do not rub back or pick at the wound, Leave the wound alone.            Medication List    STOP taking these medications  HYDROcodone-acetaminophen 5-325 MG tablet  Commonly known as:  NORCO/VICODIN      TAKE these medications        amLODipine 5 MG tablet  Commonly known as:  NORVASC  Take 5 mg by mouth daily.     cyclobenzaprine 10 MG tablet  Commonly known as:  FLEXERIL  Take 1 tablet (10 mg total) by mouth 3 (three) times daily as needed.     diclofenac 75 MG EC tablet  Commonly known as:  VOLTAREN  Take 75 mg by mouth 2 (two) times daily.     docusate sodium 100 MG capsule  Commonly known as:  COLACE  Take 1 capsule (100 mg total) by mouth 2 (two) times daily.     omeprazole 20 MG capsule  Commonly known as:  PRILOSEC  Take 20 mg by mouth daily as needed. allergies     oxyCODONE-acetaminophen 5-325 MG tablet  Commonly known as:  PERCOCET/ROXICET  Take 1-2 tablets by mouth every 4 (four) hours as needed for moderate  pain.     traMADol 50 MG tablet  Commonly known as:  ULTRAM  Take 50 mg by mouth 3 (three) times daily as needed. pain         Signed: Cristi LoronJENKINS,Andry Bogden D 08/26/2015, 9:10 AM

## 2015-08-26 NOTE — Discharge Instructions (Signed)

## 2015-08-26 NOTE — Progress Notes (Signed)
Pt doing well. Pt given D/C instructions with Rx's, verbal understanding was provided. Pt's incision is clean and dry with no sign of infection. Pt's IV was removed prior to D/C. Pt D/C'd home via wheelchair @ [phone] per MD order. Pt is stable @ D/C and has no other needs at this time. Rema FendtAshley Odessia Asleson, RN

## 2015-08-26 NOTE — Evaluation (Signed)
Physical Therapy Evaluation and Discharge Patient Details Name: Kaitlin Kirby MRN: [id number] DOB: 07-13-1965 Today's Date: 08/26/2015   History of Present Illness  Pt is a 50 y/o female who presents s/p L4-S1 laminectomy/decompression and resection of synovial cysts on 08/25/15.  Clinical Impression  Patient evaluated by Physical Therapy with no further acute PT needs identified. All education has been completed and the patient has no further questions. At the time of PT eval pt was able to perform transfers and ambulation with modified independence. 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 See below for any follow-up Physical Therapy or equipment needs. PT is signing off. Thank you for this referral.     Follow Up Recommendations Outpatient PT;Supervision for mobility/OOB    Equipment Recommendations  None recommended by PT    Recommendations for Other Services       Precautions / Restrictions Precautions Precautions: Fall;Back Precaution Booklet Issued: Yes (comment) Precaution Comments: Reviewed handout and pt was cued for precautions during functional mobility.  Restrictions Weight Bearing Restrictions: No      Mobility  Bed Mobility Overal bed mobility: Modified Independent             General bed mobility comments: HOB flat and rails lowered to simulate home environment.   Transfers Overall transfer level: Modified independent Equipment used: None             General transfer comment: Pt demonstrated proper hand placement and safety awareness.   Ambulation/Gait Ambulation/Gait assistance: Modified independent (Device/Increase time) Ambulation Distance (Feet): 400 Feet Assistive device: None Gait Pattern/deviations: Step-through pattern;Decreased stride length Gait velocity: Decreased Gait velocity interpretation: Below normal speed for age/gender General Gait Details:  VC's for improved posture and maintenance of precautions. No LOB noted.   Stairs Stairs: Yes Stairs assistance: Modified independent (Device/Increase time) Stair Management: One rail Right;Alternating pattern;Forwards Number of Stairs: 10 General stair comments: Pt demonstrated proper safety and no unsteadiness.   Wheelchair Mobility    Modified Rankin (Stroke Patients Only)       Balance Overall balance assessment: No apparent balance deficits (not formally assessed)                                           Pertinent Vitals/Pain Pain Assessment: Faces Faces Pain Scale: Hurts a little bit Pain Location: Incision site Pain Descriptors / Indicators: Operative site guarding Pain Intervention(s): Limited activity within patient's tolerance;Monitored during session;Repositioned    Home Living Family/patient expects to be discharged to:: Private residence Living Arrangements: Spouse/significant other;Children;Parent Available Help at Discharge: Family;Available PRN/intermittently Type of Home: House Home Access: Stairs to enter Entrance Stairs-Rails: Right Entrance Stairs-Number of Steps: 3-5 Home Layout: Two level Home Equipment: Cane - single point;Shower seat - built in      Prior Function Level of Independence: Independent               Hand Dominance   Dominant Hand: Right    Extremity/Trunk Assessment   Upper Extremity Assessment: Overall WFL for tasks assessed           Lower Extremity Assessment: Overall WFL for tasks assessed      Cervical / Trunk Assessment: Normal (surgical incision noted)  Communication   Communication: No difficulties  Cognition Arousal/Alertness: Awake/alert Behavior During Therapy: WFL for tasks assessed/performed Overall Cognitive  Status: Within Functional Limits for tasks assessed                      General Comments      Exercises        Assessment/Plan    PT Assessment Patent  does not need any further PT services  PT Diagnosis Difficulty walking;Acute pain   PT Problem List    PT Treatment Interventions     PT Goals (Current goals can be found in the Care Plan section) Acute Rehab PT Goals Patient Stated Goal: Return to power lifting PT Goal Formulation: All assessment and education complete, DC therapy    Frequency     Barriers to discharge        Co-evaluation               End of Session   Activity Tolerance: Patient tolerated treatment well Patient left: in chair;with call bell/phone within reach Nurse Communication: Mobility status    Functional Assessment Tool Used: Clinical judgement Functional Limitation: Mobility: Walking and moving around Mobility: Walking and Moving Around Current Status ([id number](G8978): At least 1 percent but less than 20 percent impaired, limited or restricted Mobility: Walking and Moving Around Goal Status [phone](G8979): At least 1 percent but less than 20 percent impaired, limited or restricted Mobility: Walking and Moving Around Discharge Status [phone](G8980): At least 1 percent but less than 20 percent impaired, limited or restricted    Time: 0735-0753 PT Time Calculation (min) (ACUTE ONLY): 18 min   Charges:   PT Evaluation $PT Eval Moderate Complexity: 1 Procedure     PT G Codes:   PT G-Codes **NOT FOR INPATIENT CLASS** Functional Assessment Tool Used: Clinical judgement Functional Limitation: Mobility: Walking and moving around Mobility: Walking and Moving Around Current Status ([id number]([id number]): At least 1 percent but less than 20 percent impaired, limited or restricted Mobility: Walking and Moving Around Goal Status [phone](G8979): At least 1 percent but less than 20 percent impaired, limited or restricted Mobility: Walking and Moving Around Discharge Status [phone](G8980): At least 1 percent but less than 20 percent impaired, limited or restricted    Conni SlipperKirkman, Jamail Cullers 08/26/2015, 10:31 AM   Conni SlipperLaura Farrie Sann, PT, DPT Acute Rehabilitation  Services Pager: [phone]

## 2015-08-27 NOTE — Anesthesia Postprocedure Evaluation (Signed)
Anesthesia Post Note  Patient: Kaitlin Kirby  Procedure(s) Performed: Procedure(s) (LRB): LUMBAR LAMINECTOMY/DECOMPRESSION MICRODISCECTOMY 2 LEVELS (Right)  Patient location during evaluation: PACU Anesthesia Type: General Level of consciousness: awake and alert Pain management: pain level controlled Vital Signs Assessment: post-procedure vital signs reviewed and stable Respiratory status: spontaneous breathing, nonlabored ventilation, respiratory function stable and patient connected to nasal cannula oxygen Cardiovascular status: blood pressure returned to baseline and stable Postop Assessment: no signs of nausea or vomiting Anesthetic complications: no    Last Vitals:  Filed Vitals:   08/26/15 0400 08/26/15 0735  BP: 134/79 134/78  Pulse: 84 92  Temp: 37 C 37.2 C  Resp: 20 18    Last Pain:  Filed Vitals:   08/26/15 0957  PainSc: 2                  Cecile HearingStephen Edward Turk

## 2015-09-22 ENCOUNTER — Ambulatory Visit
Admission: RE | Admit: 2015-09-22 | Discharge: 2015-09-22 | Disposition: A | Discharge status: Home / Self Care | Payer: BLUE CROSS/BLUE SHIELD | Source: Ambulatory Visit | Attending: Internal Medicine | Admitting: Internal Medicine

## 2015-09-22 ENCOUNTER — Other Ambulatory Visit: Payer: Self-pay | Admitting: Internal Medicine

## 2015-09-22 DIAGNOSIS — Z1231 Encounter for screening mammogram for malignant neoplasm of breast: Secondary | ICD-10-CM

## 2017-01-19 IMAGING — CR DG LUMBAR SPINE 1V
1 series · 1 of 1 positions shown · non-contrast
Comparison: 06/24/2015

CLINICAL DATA: Intraoperative localization 4 L4-5 and L5-S1
laminectomy

EXAM:
LUMBAR SPINE - 1 VIEW

[lat]
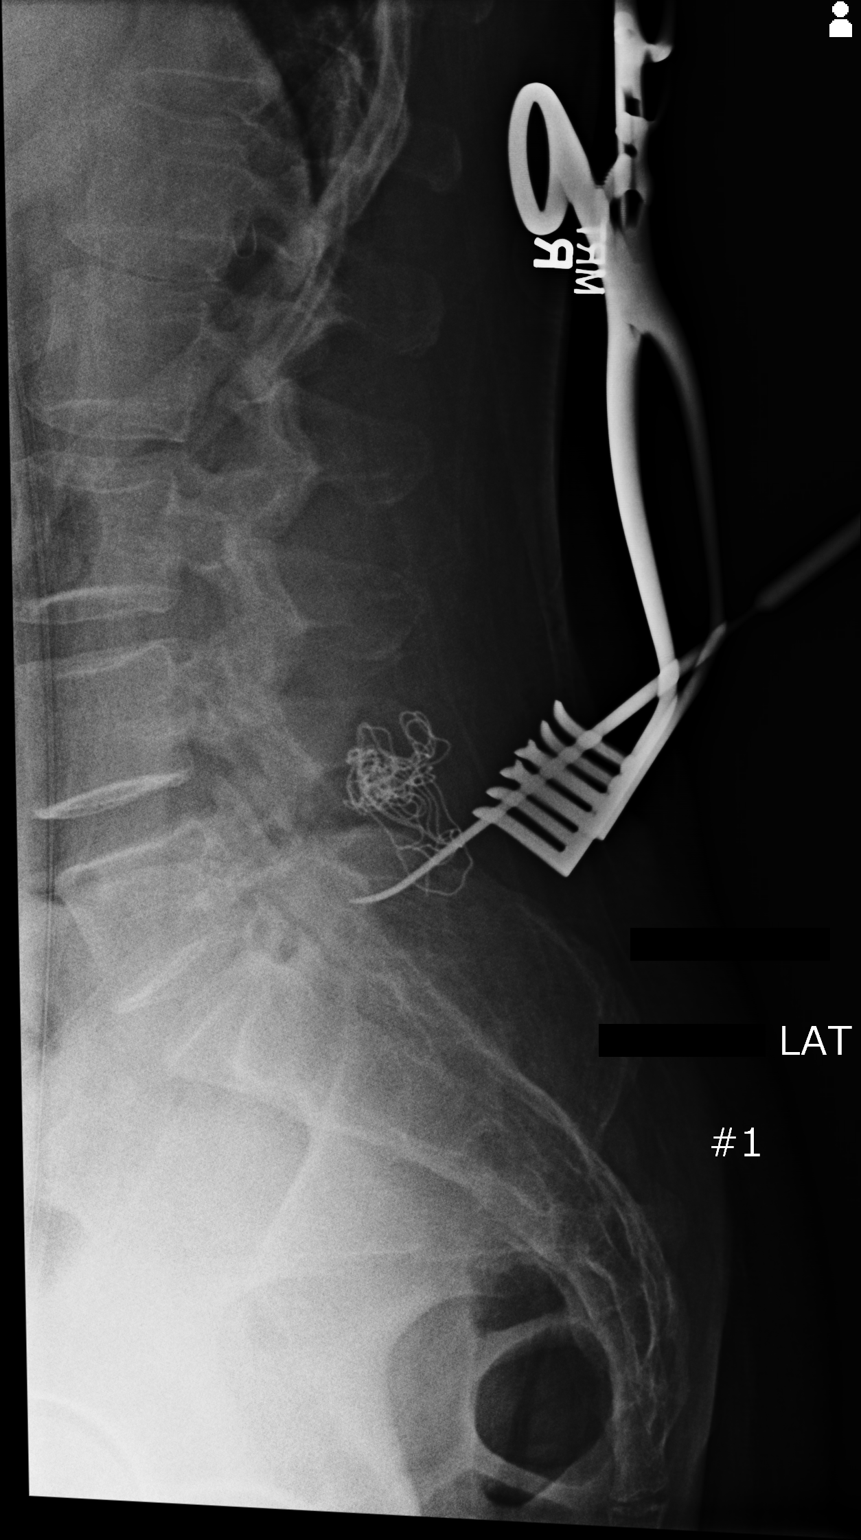

[1 of 1 positions shown; findings below may reference images not displayed]

FINDINGS: Localization probe directed toward the L5-S1 disc space level.
IMPRESSION: Intraoperative localization

## 2021-10-05 ENCOUNTER — Other Ambulatory Visit: Payer: Self-pay | Admitting: Internal Medicine

## 2021-10-05 DIAGNOSIS — Z1231 Encounter for screening mammogram for malignant neoplasm of breast: Secondary | ICD-10-CM

## 2021-10-26 ENCOUNTER — Ambulatory Visit
Admission: RE | Admit: 2021-10-26 | Discharge: 2021-10-26 | Disposition: A | Discharge status: Home / Self Care | Payer: BC Managed Care – PPO | Source: Ambulatory Visit | Attending: Internal Medicine | Admitting: Internal Medicine

## 2021-10-26 DIAGNOSIS — Z1231 Encounter for screening mammogram for malignant neoplasm of breast: Secondary | ICD-10-CM | POA: Diagnosis present

## 2023-01-23 ENCOUNTER — Other Ambulatory Visit: Payer: Self-pay | Admitting: Internal Medicine

## 2023-01-23 DIAGNOSIS — Z1231 Encounter for screening mammogram for malignant neoplasm of breast: Secondary | ICD-10-CM

## 2023-01-31 ENCOUNTER — Ambulatory Visit
Admission: RE | Admit: 2023-01-31 | Discharge: 2023-01-31 | Disposition: A | Discharge status: Home / Self Care | Payer: BC Managed Care – PPO | Source: Ambulatory Visit | Attending: Internal Medicine | Admitting: Internal Medicine

## 2023-01-31 ENCOUNTER — Encounter: Payer: Self-pay | Admitting: Radiology

## 2023-01-31 DIAGNOSIS — Z1231 Encounter for screening mammogram for malignant neoplasm of breast: Secondary | ICD-10-CM | POA: Insufficient documentation
